# Patient Record
Sex: Male | Born: 1985 | Race: White | Hispanic: No | Marital: Single | Smoking: Former smoker
Health system: Southern US, Community
[De-identification: ages and names within clinical notes are randomized; demographics above are authoritative.]

## PROBLEM LIST (undated history)

## (undated) DIAGNOSIS — K219 Gastro-esophageal reflux disease without esophagitis: Secondary | ICD-10-CM

---

## 2008-09-19 ENCOUNTER — Emergency Department (HOSPITAL_COMMUNITY): Admission: EM | Admit: 2008-09-19 | Discharge: 2008-09-19 | Payer: Self-pay | Admitting: Emergency Medicine

## 2009-02-09 ENCOUNTER — Emergency Department (HOSPITAL_COMMUNITY): Admission: EM | Admit: 2009-02-09 | Discharge: 2009-02-09 | Payer: Self-pay | Admitting: Emergency Medicine

## 2009-07-17 ENCOUNTER — Emergency Department (HOSPITAL_COMMUNITY): Admission: EM | Admit: 2009-07-17 | Discharge: 2009-07-17 | Payer: Self-pay | Admitting: Emergency Medicine

## 2009-07-30 ENCOUNTER — Emergency Department (HOSPITAL_COMMUNITY): Admission: EM | Admit: 2009-07-30 | Discharge: 2009-07-30 | Payer: Self-pay | Admitting: Emergency Medicine

## 2009-10-01 ENCOUNTER — Emergency Department (HOSPITAL_COMMUNITY): Admission: EM | Admit: 2009-10-01 | Discharge: 2009-10-02 | Payer: Self-pay | Admitting: Emergency Medicine

## 2009-10-24 ENCOUNTER — Emergency Department (HOSPITAL_COMMUNITY): Admission: EM | Admit: 2009-10-24 | Discharge: 2009-10-24 | Payer: Self-pay | Admitting: Emergency Medicine

## 2009-11-06 ENCOUNTER — Emergency Department (HOSPITAL_COMMUNITY): Admission: EM | Admit: 2009-11-06 | Discharge: 2009-11-07 | Payer: Self-pay | Admitting: Emergency Medicine

## 2009-11-07 ENCOUNTER — Emergency Department (HOSPITAL_COMMUNITY): Admission: EM | Admit: 2009-11-07 | Discharge: 2009-11-07 | Payer: Self-pay | Admitting: Emergency Medicine

## 2009-11-09 ENCOUNTER — Emergency Department (HOSPITAL_COMMUNITY): Admission: EM | Admit: 2009-11-09 | Discharge: 2009-11-09 | Payer: Self-pay | Admitting: Emergency Medicine

## 2009-11-12 ENCOUNTER — Emergency Department (HOSPITAL_COMMUNITY): Admission: EM | Admit: 2009-11-12 | Discharge: 2009-11-12 | Payer: Self-pay | Admitting: Family Medicine

## 2009-11-13 ENCOUNTER — Emergency Department (HOSPITAL_COMMUNITY): Admission: EM | Admit: 2009-11-13 | Discharge: 2009-11-13 | Payer: Self-pay | Admitting: Emergency Medicine

## 2009-11-15 ENCOUNTER — Emergency Department (HOSPITAL_COMMUNITY): Admission: EM | Admit: 2009-11-15 | Discharge: 2009-11-15 | Payer: Self-pay | Admitting: Emergency Medicine

## 2009-11-16 ENCOUNTER — Emergency Department (HOSPITAL_COMMUNITY): Admission: EM | Admit: 2009-11-16 | Discharge: 2009-11-16 | Payer: Self-pay | Admitting: Emergency Medicine

## 2009-11-19 ENCOUNTER — Emergency Department (HOSPITAL_COMMUNITY): Admission: EM | Admit: 2009-11-19 | Discharge: 2009-11-19 | Payer: Self-pay | Admitting: Emergency Medicine

## 2009-11-26 ENCOUNTER — Ambulatory Visit: Payer: Self-pay | Admitting: Internal Medicine

## 2009-11-29 ENCOUNTER — Emergency Department (HOSPITAL_COMMUNITY): Admission: EM | Admit: 2009-11-29 | Discharge: 2009-11-29 | Payer: Self-pay | Admitting: Emergency Medicine

## 2009-11-30 ENCOUNTER — Ambulatory Visit (HOSPITAL_COMMUNITY): Admission: RE | Admit: 2009-11-30 | Discharge: 2009-11-30 | Payer: Self-pay | Admitting: Family Medicine

## 2010-12-29 ENCOUNTER — Emergency Department (HOSPITAL_COMMUNITY)
Admission: EM | Admit: 2010-12-29 | Discharge: 2010-12-29 | Payer: Self-pay | Source: Home / Self Care | Admitting: Emergency Medicine

## 2011-01-06 LAB — STREP A DNA PROBE: Group A Strep Probe: NEGATIVE

## 2011-01-06 LAB — RAPID STREP SCREEN (MED CTR MEBANE ONLY): Streptococcus, Group A Screen (Direct): NEGATIVE

## 2011-01-21 IMAGING — CT CT NECK W/ CM
3 of 4 series · 16 of 33 positions shown, 19 images · IV contrast (100 ML OMNI 300)
Comparison: Neck radiographs performed 11/13/2009

CLINICAL DATA: Sore throat for 1 week, with sensation of right neck
swelling.

CT NECK WITH CONTRAST
TECHNIQUE: Multidetector CT imaging of the neck was performed with
intravenous contrast.
Contrast: 100 mL of Omnipaque 300 IV contrast

[Series 2: 2cc/30ml and 1cc/45ml · axial · 0.47mm/px · z∈[-264,-61]mm · 8 of 105 slices shown, 10 images]
[im 12/105  soft-tissue]
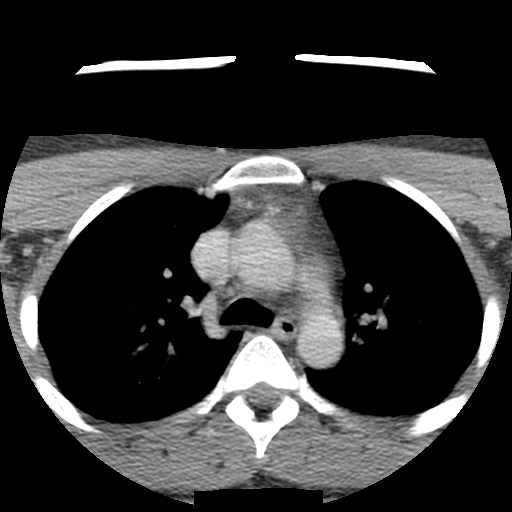
[im 12/105  bone]
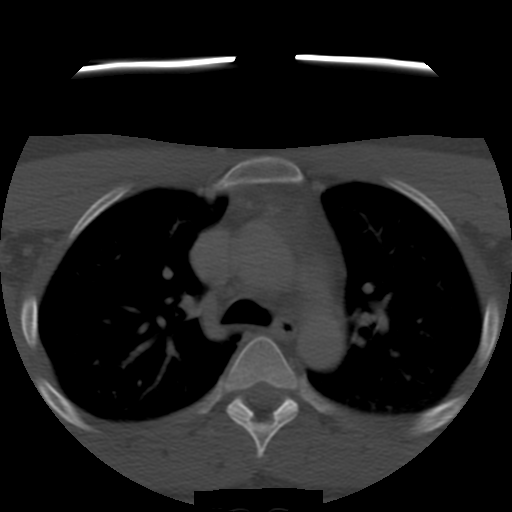
[im 24/105  bone]
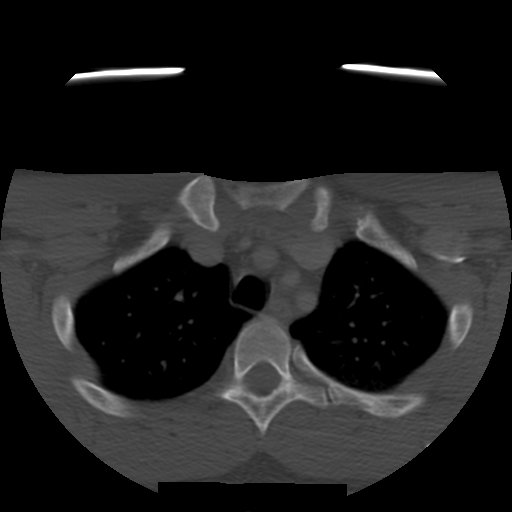
[im 35/105  bone]
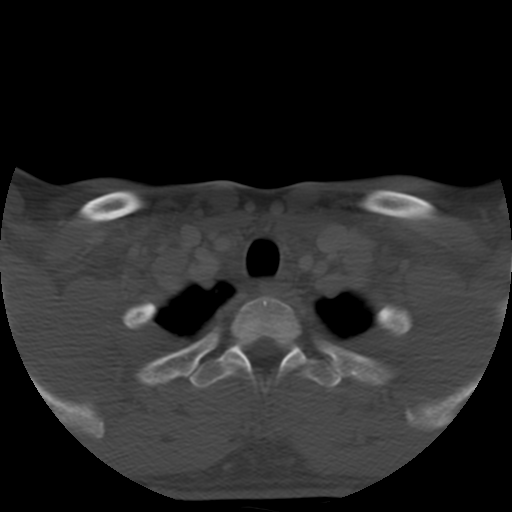
[im 47/105  bone]
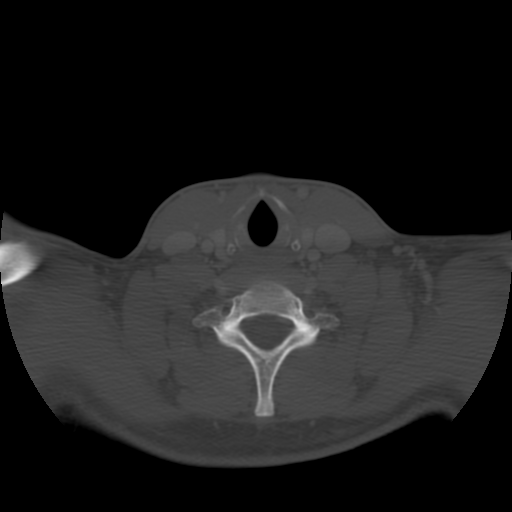
[im 58/105  soft-tissue]
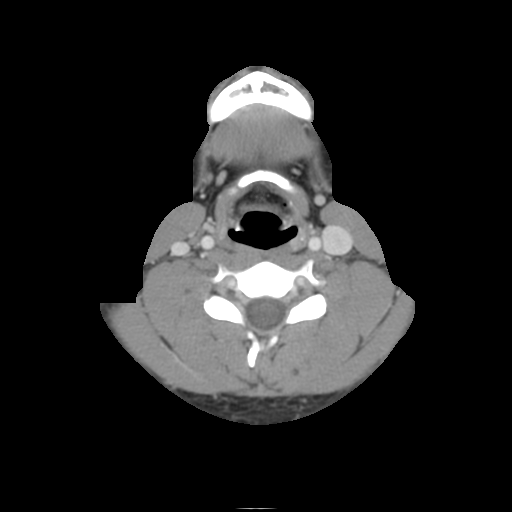
[im 58/105  bone]
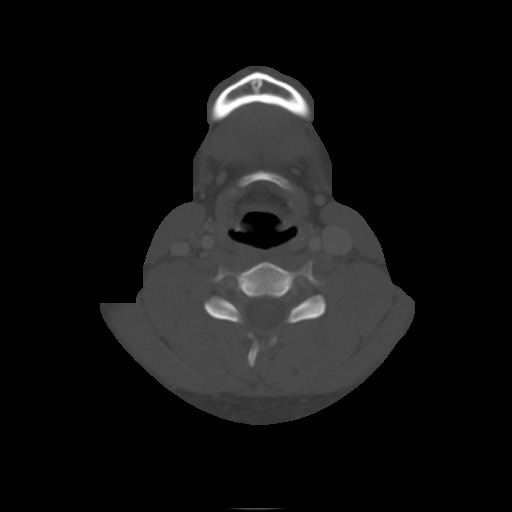
[im 70/105  bone]
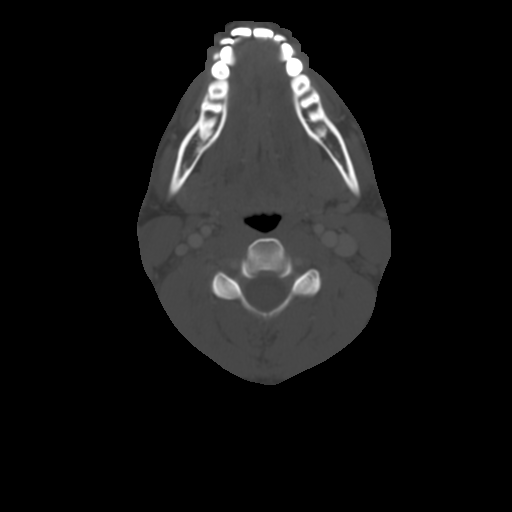
[im 81/105  bone]
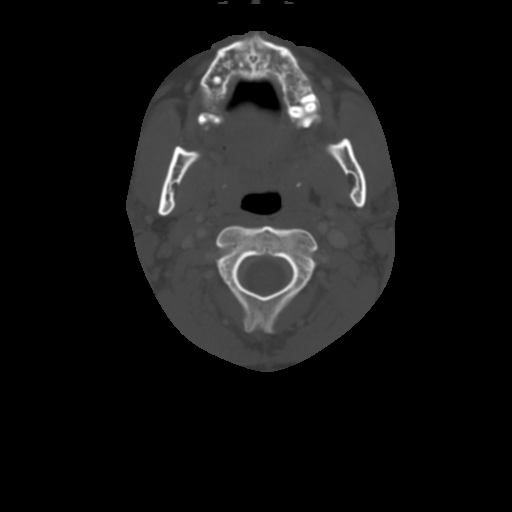
[im 93/105  bone]
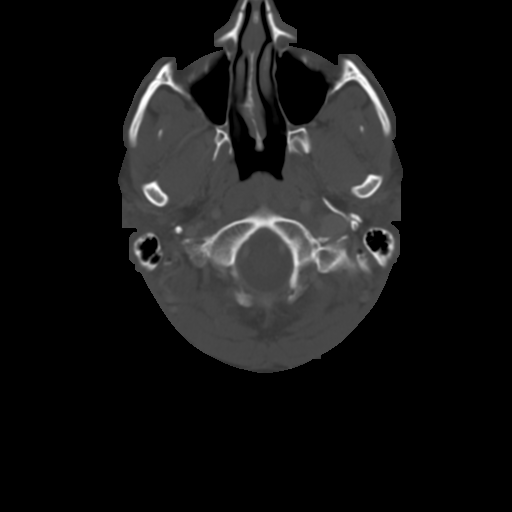

[Series 300: sagittal · sagittal · 0.52mm/px · 5 of 51 slices shown, 6 images]
[im 17/51  bone]
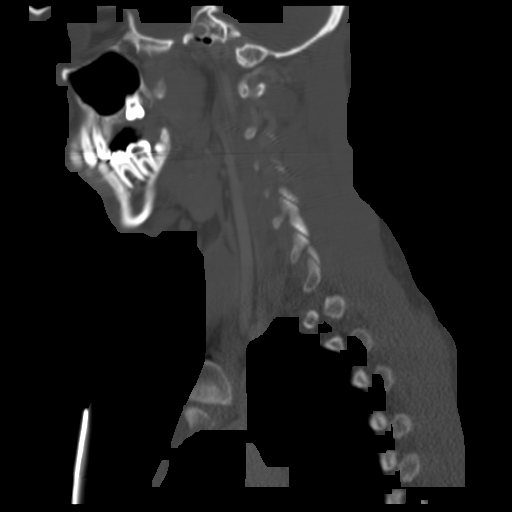
[im 21/51  bone]
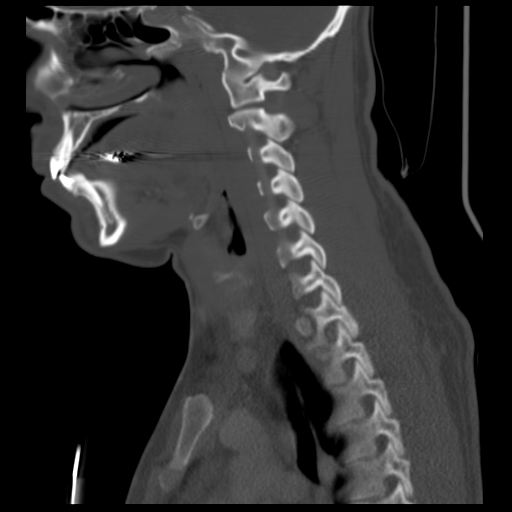
[im 26/51  soft-tissue]
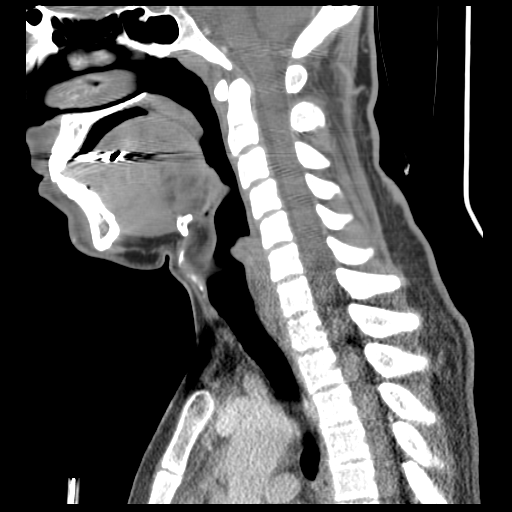
[im 26/51  bone]
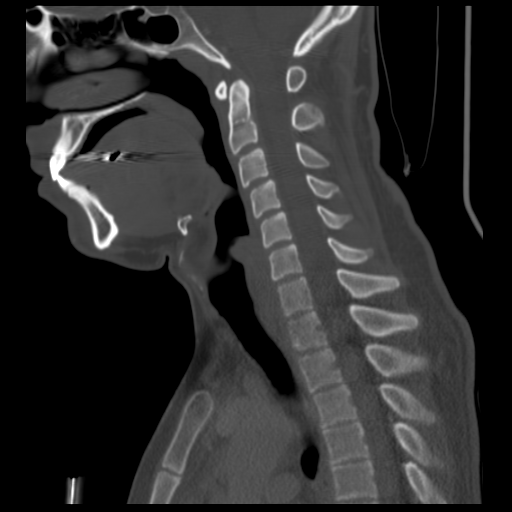
[im 30/51  bone]
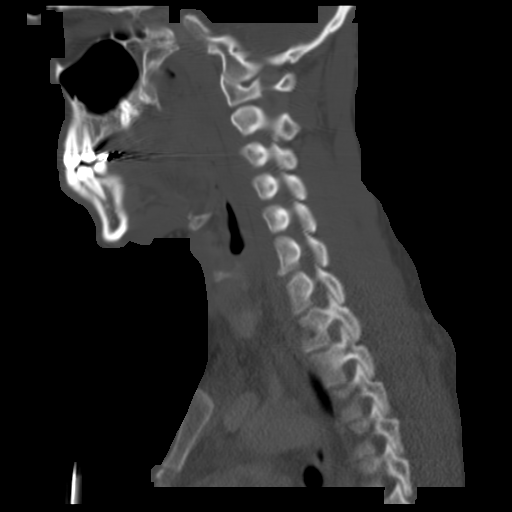
[im 34/51  bone]
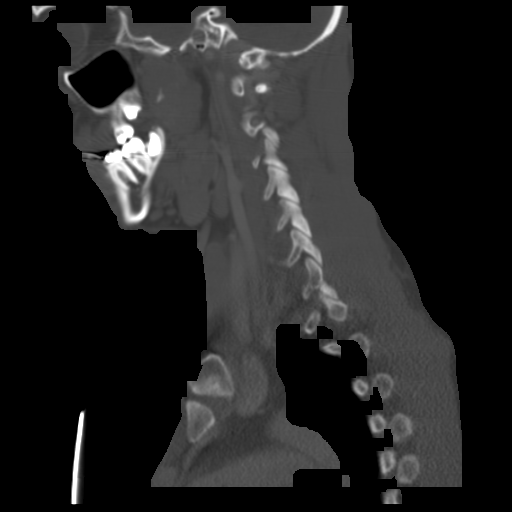

[Series 301: coronal · coronal · 0.52mm/px · 3 of 51 slices shown]
[im 11/51  bone]
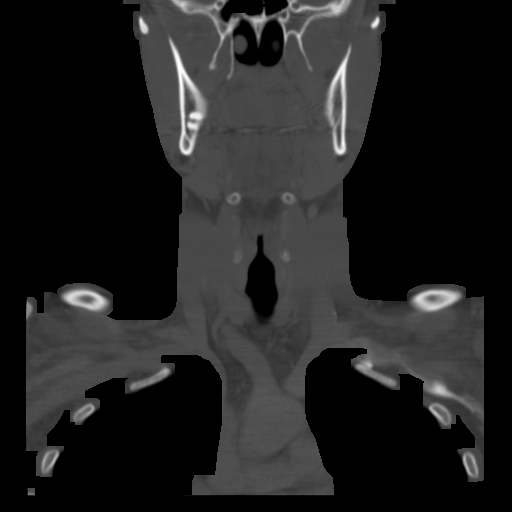
[im 21/51  bone]
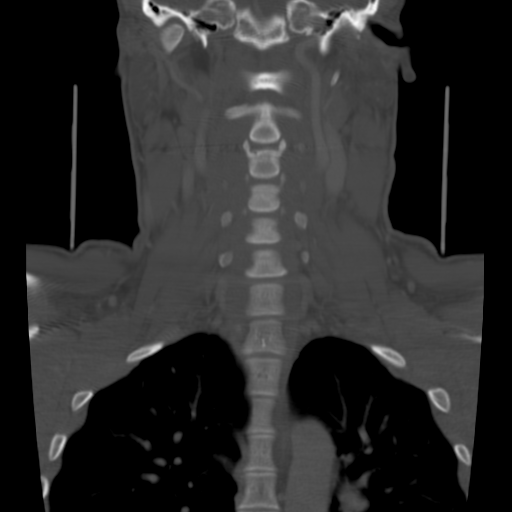
[im 31/51  bone]
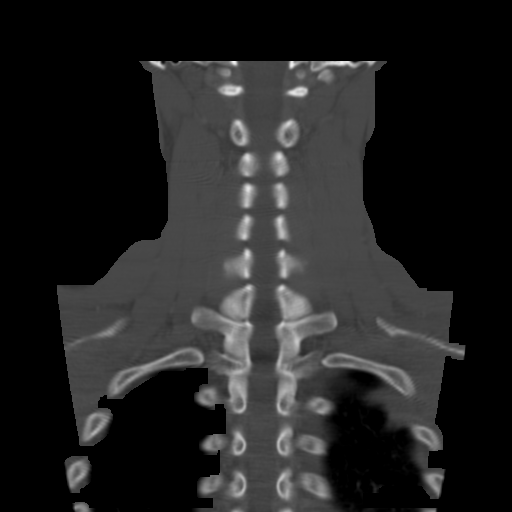

[16 of 33 positions shown; findings below may reference images not displayed]

FINDINGS: No significant soft tissue swelling is seen.  No
tonsillar edema is evident.  The parapharyngeal fat planes are
preserved.  No fluid collections are seen to suggest abscess
formation.

Scattered small peritonsillar calcifications are noted bilaterally,
reflecting chronic inflammation.  Scattered small cervical nodes
are noted bilaterally, without evidence of cervical
lymphadenopathy.

The visualized vasculature is unremarkable in appearance.  There is
no evidence of vascular compromise.  The thyroid gland is within
normal limits.  The great vessels are intact.  The visualized
portions of the mediastinum are unremarkable.  The visualized
portions of both lungs are clear.

No acute osseous abnormalities are identified.  The orbits are
within normal limits.  The paranasal sinuses and mastoid air cells
are well-aerated.  The visualized portions of the brain are
unremarkable.
IMPRESSION: 1.  Small small peritonsillar calcifications likely reflect chronic
inflammation.
2.  Otherwise unremarkable CT of the neck; no evidence of
significant soft tissue swelling or abscess formation.

## 2011-03-26 LAB — POCT I-STAT, CHEM 8
BUN: 14 mg/dL (ref 6–23)
Calcium, Ion: 1.18 mmol/L (ref 1.12–1.32)
Chloride: 101 meq/L (ref 96–112)
Creatinine, Ser: 1 mg/dL (ref 0.4–1.5)
Glucose, Bld: 91 mg/dL (ref 70–99)
HCT: 49 % (ref 39.0–52.0)
Hemoglobin: 16.7 g/dL (ref 13.0–17.0)
Potassium: 4 meq/L (ref 3.5–5.1)
Sodium: 139 meq/L (ref 135–145)
TCO2: 28 mmol/L (ref 0–100)

## 2011-03-26 LAB — URINALYSIS, ROUTINE W REFLEX MICROSCOPIC
Bilirubin Urine: NEGATIVE
Glucose, UA: NEGATIVE mg/dL
Hgb urine dipstick: NEGATIVE
Ketones, ur: NEGATIVE mg/dL
Nitrite: NEGATIVE
Protein, ur: NEGATIVE mg/dL
Specific Gravity, Urine: 1.021 (ref 1.005–1.030)
Urobilinogen, UA: 0.2 mg/dL (ref 0.0–1.0)
pH: 5.5 (ref 5.0–8.0)

## 2011-03-26 LAB — CBC
HCT: 48.4 % (ref 39.0–52.0)
Hemoglobin: 17.3 g/dL — ABNORMAL HIGH (ref 13.0–17.0)
MCHC: 35.8 g/dL (ref 30.0–36.0)
MCV: 93.1 fL (ref 78.0–100.0)
Platelets: 157 K/uL (ref 150–400)
RBC: 5.2 MIL/uL (ref 4.22–5.81)
RDW: 12.6 % (ref 11.5–15.5)
WBC: 6.4 K/uL (ref 4.0–10.5)

## 2011-03-26 LAB — DIFFERENTIAL
Basophils Absolute: 0 K/uL (ref 0.0–0.1)
Basophils Relative: 0 % (ref 0–1)
Eosinophils Absolute: 0.1 K/uL (ref 0.0–0.7)
Eosinophils Relative: 2 % (ref 0–5)
Lymphocytes Relative: 25 % (ref 12–46)
Lymphs Abs: 1.6 K/uL (ref 0.7–4.0)
Monocytes Absolute: 0.5 K/uL (ref 0.1–1.0)
Monocytes Relative: 8 % (ref 3–12)
Neutro Abs: 4.2 K/uL (ref 1.7–7.7)
Neutrophils Relative %: 66 % (ref 43–77)

## 2011-03-26 LAB — BASIC METABOLIC PANEL WITH GFR
BUN: 12 mg/dL (ref 6–23)
CO2: 27 meq/L (ref 19–32)
Calcium: 9.7 mg/dL (ref 8.4–10.5)
Chloride: 101 meq/L (ref 96–112)
Creatinine, Ser: 0.86 mg/dL (ref 0.4–1.5)
GFR calc non Af Amer: >60 mL/min
Glucose, Bld: 84 mg/dL (ref 70–99)
Potassium: 3.9 meq/L (ref 3.5–5.1)
Sodium: 137 meq/L (ref 135–145)

## 2011-03-26 LAB — RAPID STREP SCREEN (MED CTR MEBANE ONLY): Streptococcus, Group A Screen (Direct): NEGATIVE

## 2011-03-26 LAB — GLUCOSE, CAPILLARY: Glucose-Capillary: 85 mg/dL (ref 70–99)

## 2011-03-29 LAB — RAPID STREP SCREEN (MED CTR MEBANE ONLY): Streptococcus, Group A Screen (Direct): NEGATIVE

## 2011-03-30 LAB — CBC
HCT: 45.2 % (ref 39.0–52.0)
Hemoglobin: 15.8 g/dL (ref 13.0–17.0)
MCHC: 34.9 g/dL (ref 30.0–36.0)
MCV: 92.8 fL (ref 78.0–100.0)
Platelets: 155 10*3/uL (ref 150–400)
RBC: 4.87 MIL/uL (ref 4.22–5.81)
RDW: 13 % (ref 11.5–15.5)
WBC: 6.3 10*3/uL (ref 4.0–10.5)

## 2011-03-30 LAB — D-DIMER, QUANTITATIVE: D-Dimer, Quant: 0.26 ug/mL-FEU (ref 0.00–0.48)

## 2011-03-30 LAB — POCT CARDIAC MARKERS
CKMB, poc: 1.1 ng/mL (ref 1.0–8.0)
CKMB, poc: <1 ng/mL — ABNORMAL LOW (ref 1.0–8.0)
Myoglobin, poc: 64.3 ng/mL (ref 12–200)
Myoglobin, poc: 74.6 ng/mL (ref 12–200)
Troponin i, poc: <0.05 ng/mL (ref 0.00–0.09)
Troponin i, poc: <0.05 ng/mL (ref 0.00–0.09)

## 2011-03-30 LAB — DIFFERENTIAL
Basophils Absolute: 0 10*3/uL (ref 0.0–0.1)
Basophils Relative: 0 % (ref 0–1)
Eosinophils Absolute: 0.3 10*3/uL (ref 0.0–0.7)
Eosinophils Relative: 5 % (ref 0–5)
Lymphocytes Relative: 28 % (ref 12–46)
Lymphs Abs: 1.8 10*3/uL (ref 0.7–4.0)
Monocytes Absolute: 0.7 10*3/uL (ref 0.1–1.0)
Monocytes Relative: 11 % (ref 3–12)
Neutro Abs: 3.5 10*3/uL (ref 1.7–7.7)
Neutrophils Relative %: 56 % (ref 43–77)

## 2011-03-30 LAB — POCT I-STAT, CHEM 8
BUN: 11 mg/dL (ref 6–23)
Calcium, Ion: 1.18 mmol/L (ref 1.12–1.32)
Chloride: 102 mEq/L (ref 96–112)
Creatinine, Ser: 1 mg/dL (ref 0.4–1.5)
Glucose, Bld: 97 mg/dL (ref 70–99)
HCT: 48 % (ref 39.0–52.0)
Hemoglobin: 16.3 g/dL (ref 13.0–17.0)
Potassium: 3.6 mEq/L (ref 3.5–5.1)
Sodium: 140 mEq/L (ref 135–145)
TCO2: 27 mmol/L (ref 0–100)

## 2012-02-25 ENCOUNTER — Encounter (HOSPITAL_COMMUNITY): Payer: Self-pay | Admitting: *Deleted

## 2012-02-25 ENCOUNTER — Emergency Department (HOSPITAL_COMMUNITY): Payer: Self-pay

## 2012-02-25 ENCOUNTER — Emergency Department (HOSPITAL_COMMUNITY)
Admission: EM | Admit: 2012-02-25 | Discharge: 2012-02-25 | Disposition: A | Discharge status: Home / Self Care | Payer: Self-pay | Attending: Emergency Medicine | Admitting: Emergency Medicine

## 2012-02-25 DIAGNOSIS — J40 Bronchitis, not specified as acute or chronic: Secondary | ICD-10-CM | POA: Insufficient documentation

## 2012-02-25 DIAGNOSIS — R0602 Shortness of breath: Secondary | ICD-10-CM | POA: Insufficient documentation

## 2012-02-25 DIAGNOSIS — R42 Dizziness and giddiness: Secondary | ICD-10-CM | POA: Insufficient documentation

## 2012-02-25 DIAGNOSIS — R079 Chest pain, unspecified: Secondary | ICD-10-CM | POA: Insufficient documentation

## 2012-02-25 HISTORY — DX: Gastro-esophageal reflux disease without esophagitis: K21.9

## 2012-02-25 LAB — CBC
MCH: 31.7 pg (ref 26.0–34.0)
MCHC: 35.3 g/dL (ref 30.0–36.0)
MCV: 89.8 fL (ref 78.0–100.0)
Platelets: 192 10*3/uL (ref 150–400)
RBC: 4.82 MIL/uL (ref 4.22–5.81)

## 2012-02-25 LAB — BASIC METABOLIC PANEL
CO2: 29 mEq/L (ref 19–32)
Calcium: 9.8 mg/dL (ref 8.4–10.5)
Creatinine, Ser: 0.86 mg/dL (ref 0.50–1.35)
GFR calc non Af Amer: >90 mL/min (ref >90)
Sodium: 139 mEq/L (ref 135–145)

## 2012-02-25 LAB — TROPONIN I: Troponin I: <0.3 ng/mL (ref <0.30)

## 2012-02-25 LAB — D-DIMER, QUANTITATIVE: D-Dimer, Quant: <0.22 ug/mL-FEU (ref 0.00–0.48)

## 2012-02-25 NOTE — Discharge Instructions (Signed)
As we discussed, your tests today did not show that your pain was caused by a heart attack, pneumonia, or a blood clot in your lungs. Your EKG did not show any concerning findings. Your labs were otherwise normal. You should follow-up with a regular doctor for further discussion of your symptoms to see if any additional testing is needed.     Chest Pain (Nonspecific) It is often hard to give a specific diagnosis for the cause of chest pain. There is always a chance that your pain could be related to something serious, such as a heart attack or a blood clot in the lungs. You need to follow up with your caregiver for further evaluation. CAUSES   Heartburn.   Pneumonia or bronchitis.   Anxiety or stress.   Inflammation around your heart (pericarditis) or lung (pleuritis or pleurisy).   A blood clot in the lung.   A collapsed lung (pneumothorax). It can develop suddenly on its own (spontaneous pneumothorax) or from injury (trauma) to the chest.   Shingles infection (herpes zoster virus).  The chest wall is composed of bones, muscles, and cartilage. Any of these can be the source of the pain.  The bones can be bruised by injury.   The muscles or cartilage can be strained by coughing or overwork.   The cartilage can be affected by inflammation and become sore (costochondritis).  DIAGNOSIS  Lab tests or other studies, such as X-rays, electrocardiography, stress testing, or cardiac imaging, may be needed to find the cause of your pain.  TREATMENT   Treatment depends on what may be causing your chest pain. Treatment may include:   Acid blockers for heartburn.   Anti-inflammatory medicine.   Pain medicine for inflammatory conditions.   Antibiotics if an infection is present.   You may be advised to change lifestyle habits. This includes stopping smoking and avoiding alcohol, caffeine, and chocolate.   You may be advised to keep your head raised (elevated) when sleeping. This reduces  the chance of acid going backward from your stomach into your esophagus.   Most of the time, nonspecific chest pain will improve within 2 to 3 days with rest and mild pain medicine.  HOME CARE INSTRUCTIONS   If antibiotics were prescribed, take your antibiotics as directed. Finish them even if you start to feel better.   For the next few days, avoid physical activities that bring on chest pain. Continue physical activities as directed.   Do not smoke.   Avoid drinking alcohol.   Only take over-the-counter or prescription medicine for pain, discomfort, or fever as directed by your caregiver.   Follow your caregiver's suggestions for further testing if your chest pain does not go away.   Keep any follow-up appointments you made. If you do not go to an appointment, you could develop lasting (chronic) problems with pain. If there is any problem keeping an appointment, you must call to reschedule.  SEEK MEDICAL CARE IF:   You think you are having problems from the medicine you are taking. Read your medicine instructions carefully.   Your chest pain does not go away, even after treatment.   You develop a rash with blisters on your chest.  SEEK IMMEDIATE MEDICAL CARE IF:   You have increased chest pain or pain that spreads to your arm, neck, jaw, back, or abdomen.   You develop shortness of breath, an increasing cough, or you are coughing up blood.   You have severe back or abdominal pain,  feel nauseous, or vomit.   You develop severe weakness, fainting, or chills.   You have a fever.  THIS IS AN EMERGENCY. Do not wait to see if the pain will go away. Get medical help at once. Call your local emergency services (911 in [place] Do not drive yourself to the hospital. MAKE SURE YOU:   Understand these instructions.   Will watch your condition.   Will get help right away if you are not doing well or get worse.  Document Released: 09/17/2005 Document Revised: 11/27/2011 Document  Reviewed: 07/13/2008 [facility] Patient Information 2012 [place], [place].          Bronchitis Bronchitis is the body's way of reacting to injury and/or infection (inflammation) of the bronchi. Bronchi are the air tubes that extend from the windpipe into the lungs. If the inflammation becomes severe, it may cause shortness of breath. CAUSES  Inflammation may be caused by:  A virus.   Germs (bacteria).   Dust.   Allergens.   Pollutants and many other irritants.  The cells lining the bronchial tree are covered with tiny hairs (cilia). These constantly beat upward, away from the lungs, toward the mouth. This keeps the lungs free of pollutants. When these cells become too irritated and are unable to do their job, mucus begins to develop. This causes the characteristic cough of bronchitis. The cough clears the lungs when the cilia are unable to do their job. Without either of these protective mechanisms, the mucus would settle in the lungs. Then you would develop pneumonia. Smoking is a common cause of bronchitis and can contribute to pneumonia. Stopping this habit is the single most important thing you can do to help yourself. TREATMENT   Your caregiver may prescribe an antibiotic if the cough is caused by bacteria. Also, medicines that open up your airways make it easier to breathe. Your caregiver may also recommend or prescribe an expectorant. It will loosen the mucus to be coughed up. Only take over-the-counter or prescription medicines for pain, discomfort, or fever as directed by your caregiver.   Removing whatever causes the problem (smoking, for example) is critical to preventing the problem from getting worse.   Cough suppressants may be prescribed for relief of cough symptoms.   Inhaled medicines may be prescribed to help with symptoms now and to help prevent problems from returning.   For those with recurrent (chronic) bronchitis, there may be a need for steroid medicines.    SEEK IMMEDIATE MEDICAL CARE IF:   During treatment, you develop more pus-like mucus (purulent sputum).   You have a fever.   Your baby is older than 3 months with a rectal temperature of 102 F (38.9 C) or higher.   Your baby is 58 months old or younger with a rectal temperature of 100.4 F (38 C) or higher.   You become progressively more ill.   You have increased difficulty breathing, wheezing, or shortness of breath.  It is necessary to seek immediate medical care if you are elderly or sick from any other disease. MAKE SURE YOU:   Understand these instructions.   Will watch your condition.   Will get help right away if you are not doing well or get worse.  Document Released: 12/08/2005 Document Revised: 11/27/2011 Document Reviewed: 10/17/2008 [facility] Patient Information 2012 [place], [place].       If you have no primary doctor, here are some resources that may be helpful:  Medicaid-accepting [facility] Providers:   - [facility]- [address]      [phone]      Mon-Fri 9am-7pm, Sat 9am-1pm   - [facility]- [address] [place]      [phone]   - [facility]- [address], [address] [place]      [phone]   [facility]- [address]      [phone]   - Renaye Rakers- [address] [place], [address]      [phone]      Only accepts [place] patients       after they have her name applied to their card   Self Pay (no insurance) in [place]:   - Sickle Cell Patients: Dr Willey Blade, [facility]      [address] [place]      [phone]   - [phone]   - Physician Referral Service- [phone]   - [facility]- [address] [place]      [phone]   [facility] [place]- [address]   - [facility]- see information above      (Speak to [employer] if you do not  have insurance)   - [facility]- [address] [place]      [phone]   - [facility] [place]- [address]      [phone]   - [facility]- [address]      [phone]   - Dr Julio Sicks-  [address], [place]      [phone]   - [facility]- [address]      [phone]   - [facility]- [address]      [phone]      Also [address]      [phone]   - [facility]- [address]      [phone]      1st and 3rd Saturday every month, 10am-1pm Other agencies that provide inexpensive medical care:    [facility]  [phone]    [facility]  [phone]    [facility]  [phone]    [facility]  [phone]    [facility]  [phone]  General Information: Finding a doctor when you do not have health insurance can be tricky. Although you are not limited by an insurance plan, you are of course limited by her finances and how much but he can pay out of pocket.  What are your options if you don't have health insurance?   1) Find a Librarian, academic and Pay Out of Pocket Although you won't have to find out who is covered by your insurance plan, it is a good idea to ask around and get recommendations. You will then need to call the office and see if the doctor you have chosen will accept you as a new patient and what types of options they offer for patients who are self-pay. Some doctors offer discounts or will set up payment plans for their patients who do not have insurance, but you will need to ask so you aren't surprised when you get to your appointment.  2) Contact Your Local Health Department Not all health departments have doctors that can see patients for sick visits, but many do, so  it is worth a call to see if yours does. If you don't know where your local health department is, you can check in your phone book. The CDC also has a tool to help you locate your  state's health department, and many state websites also have listings of all of their local health departments.  3) Find a Walk-in Clinic If your illness is not likely to be very severe or complicated, you may want to try a walk in clinic. These are popping up all over the country in pharmacies, drugstores, and shopping centers. They're usually staffed by nurse practitioners or physician assistants that have been trained to treat common illnesses and complaints. They're usually fairly quick and inexpensive. However, if you have serious medical issues or chronic medical problems, these are probably not your best option

## 2012-02-25 NOTE — ED Notes (Signed)
MD at bedside. Dr. Manus Gunning at bedside. Pt ready for d/c.

## 2012-02-25 NOTE — Progress Notes (Signed)
Pt listed as self pay with no insurance coverage Pt confirms he is self pay [place] resident.  CM and [facility] coordinator spoke with him Pt refused offered [facility] services to assist with finding a [place] self pay provider 

## 2012-02-25 NOTE — Progress Notes (Signed)
Pt refused [facility] service because he is awaiting for probational period to end at new job and employee insurance to "kick in"
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
## 2012-02-25 NOTE — ED Notes (Signed)
MD at bedside. 

## 2012-02-25 NOTE — ED Notes (Signed)
Pt states "for about a wk and a half, I've had left sided chest pain that makes my left arm hurt, today I felt dizzy and it scared me, I run 5k on the treadmill every day"

## 2012-02-26 NOTE — ED Provider Notes (Signed)
Medical screening examination/treatment/procedure(s) were conducted as a shared visit with non-physician practitioner(s) and myself.  I personally evaluated the patient during the encounter  2 weeks of intermittent L sided chest soreness.  Today with different R sided "kicked in the chest" pain that lasted 1 minute.  Now resolved.  Some SOB.  Runs daily on treadmill without problems.  No family history of sudden cardiac death. EKG without ischemia.  No Brugada, no LVH Atypical chest pain.   Glynn Octave, MD 02/26/12 [phone]

## 2012-09-03 ENCOUNTER — Encounter (HOSPITAL_COMMUNITY): Payer: Self-pay

## 2012-09-03 ENCOUNTER — Emergency Department (HOSPITAL_COMMUNITY)
Admission: EM | Admit: 2012-09-03 | Discharge: 2012-09-04 | Disposition: A | Discharge status: Home / Self Care | Payer: No Typology Code available for payment source | Attending: Emergency Medicine | Admitting: Emergency Medicine

## 2012-09-03 DIAGNOSIS — Z87891 Personal history of nicotine dependence: Secondary | ICD-10-CM | POA: Insufficient documentation

## 2012-09-03 DIAGNOSIS — Y92410 Unspecified street and highway as the place of occurrence of the external cause: Secondary | ICD-10-CM | POA: Insufficient documentation

## 2012-09-03 DIAGNOSIS — K219 Gastro-esophageal reflux disease without esophagitis: Secondary | ICD-10-CM | POA: Insufficient documentation

## 2012-09-03 DIAGNOSIS — M436 Torticollis: Secondary | ICD-10-CM | POA: Insufficient documentation

## 2012-09-03 MED ORDER — ACETAMINOPHEN 325 MG PO TABS
650.0000 mg | ORAL_TABLET | Freq: Once | ORAL | Status: AC
  Administered 2012-09-03: 650 mg via ORAL
  Filled 2012-09-03: qty 2
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
## 2012-09-04 ENCOUNTER — Emergency Department (HOSPITAL_COMMUNITY): Payer: No Typology Code available for payment source

## 2012-09-04 MED ORDER — CYCLOBENZAPRINE HCL 10 MG PO TABS: 10.0000 mg | ORAL_TABLET | Freq: Three times a day (TID) | ORAL | Status: AC | PRN

## 2012-09-04 MED ORDER — IBUPROFEN 400 MG PO TABS: 600.0000 mg | ORAL_TABLET | Freq: Four times a day (QID) | ORAL | Status: AC | PRN

## 2012-09-04 NOTE — ED Provider Notes (Signed)
Medical screening examination/treatment/procedure(s) were performed by non-physician practitioner and as supervising physician I was immediately available for consultation/collaboration. Devoria Albe, MD, FACEP   Ward Givens, MD 09/04/12 1500

## 2012-09-04 NOTE — ED Notes (Signed)
Patient transported to X-ray 

## 2012-09-06 ENCOUNTER — Emergency Department (INDEPENDENT_AMBULATORY_CARE_PROVIDER_SITE_OTHER): Payer: Self-pay

## 2012-09-06 ENCOUNTER — Encounter (HOSPITAL_COMMUNITY): Payer: Self-pay | Admitting: Emergency Medicine

## 2012-09-06 ENCOUNTER — Emergency Department (INDEPENDENT_AMBULATORY_CARE_PROVIDER_SITE_OTHER)
Admission: EM | Admit: 2012-09-06 | Discharge: 2012-09-06 | Disposition: A | Discharge status: Home / Self Care | Payer: Self-pay | Source: Home / Self Care

## 2012-09-06 DIAGNOSIS — S139XXA Sprain of joints and ligaments of unspecified parts of neck, initial encounter: Secondary | ICD-10-CM

## 2012-09-06 DIAGNOSIS — S161XXA Strain of muscle, fascia and tendon at neck level, initial encounter: Secondary | ICD-10-CM
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
## 2012-09-07 NOTE — ED Provider Notes (Signed)
Medical screening examination/treatment/procedure(s) were performed by resident physician or non-physician practitioner and as supervising physician I was immediately available for consultation/collaboration.   Barkley Bruns MD.    Linna Hoff, MD 09/07/12 [phone]

## 2015-09-24 ENCOUNTER — Encounter (HOSPITAL_COMMUNITY): Payer: Self-pay | Admitting: *Deleted

## 2015-09-24 ENCOUNTER — Emergency Department (HOSPITAL_COMMUNITY)
Admission: EM | Admit: 2015-09-24 | Discharge: 2015-09-24 | Disposition: A | Discharge status: Home / Self Care | Payer: Managed Care, Other (non HMO) | Attending: Emergency Medicine | Admitting: Emergency Medicine

## 2015-09-24 DIAGNOSIS — Z79899 Other long term (current) drug therapy: Secondary | ICD-10-CM | POA: Insufficient documentation

## 2015-09-24 DIAGNOSIS — R200 Anesthesia of skin: Secondary | ICD-10-CM | POA: Diagnosis not present

## 2015-09-24 DIAGNOSIS — K219 Gastro-esophageal reflux disease without esophagitis: Secondary | ICD-10-CM | POA: Diagnosis not present

## 2015-09-24 DIAGNOSIS — Z87891 Personal history of nicotine dependence: Secondary | ICD-10-CM | POA: Insufficient documentation

## 2015-09-24 DIAGNOSIS — R42 Dizziness and giddiness: Secondary | ICD-10-CM | POA: Diagnosis present

## 2015-09-24 DIAGNOSIS — R110 Nausea: Secondary | ICD-10-CM | POA: Diagnosis not present

## 2015-09-24 DIAGNOSIS — Z791 Long term (current) use of non-steroidal anti-inflammatories (NSAID): Secondary | ICD-10-CM | POA: Diagnosis not present

## 2015-09-24 LAB — BASIC METABOLIC PANEL
Anion gap: 7 (ref 5–15)
BUN: 13 mg/dL (ref 6–20)
CO2: 27 mmol/L (ref 22–32)
Calcium: 9.6 mg/dL (ref 8.9–10.3)
Chloride: 103 mmol/L (ref 101–111)
Creatinine, Ser: 0.99 mg/dL (ref 0.61–1.24)
GFR calc Af Amer: >60 mL/min (ref >60)
GLUCOSE: 120 mg/dL — AB (ref 65–99)
POTASSIUM: 3.7 mmol/L (ref 3.5–5.1)
Sodium: 137 mmol/L (ref 135–145)

## 2015-09-24 LAB — CBC
HEMATOCRIT: 44.6 % (ref 39.0–52.0)
Hemoglobin: 15.9 g/dL (ref 13.0–17.0)
MCH: 32.6 pg (ref 26.0–34.0)
MCHC: 35.7 g/dL (ref 30.0–36.0)
MCV: 91.4 fL (ref 78.0–100.0)
Platelets: 157 10*3/uL (ref 150–400)
RBC: 4.88 MIL/uL (ref 4.22–5.81)
RDW: 12.4 % (ref 11.5–15.5)
WBC: 6.3 10*3/uL (ref 4.0–10.5)

## 2015-09-24 LAB — CBG MONITORING, ED: Glucose-Capillary: 110 mg/dL — ABNORMAL HIGH (ref 65–99)

## 2015-09-24 MED ORDER — MECLIZINE HCL 50 MG PO TABS: 25.0000 mg | ORAL_TABLET | Freq: Three times a day (TID) | ORAL | Status: AC | PRN
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
## 2015-09-24 NOTE — Discharge Instructions (Signed)
Please take your prescription of meclizine as prescribed when needed for dizziness/lightheadedness.  Please follow up with a primary care provider from the Resource Guide provided below in 1 week. Please return to the Emergency Department if symptoms worsen or new onset of fever, neck stiffness, visual changes, photophobia, urinary symptoms, weakness, seizures, syncope.    Emergency Department Resource Guide 1) Find a Doctor and Pay Out of Pocket Although you won't have to find out who is covered by your insurance plan, it is a good idea to ask around and get recommendations. You will then need to call the office and see if the doctor you have chosen will accept you as a new patient and what types of options they offer for patients who are self-pay. Some doctors offer discounts or will set up payment plans for their patients who do not have insurance, but you will need to ask so you aren't surprised when you get to your appointment.  2) Contact Your Local Health Department Not all health departments have doctors that can see patients for sick visits, but many do, so it is worth a call to see if yours does. If you don't know where your local health department is, you can check in your phone book. The CDC also has a tool to help you locate your state's health department, and many state websites also have listings of all of their local health departments.  3) Find a Walk-in Clinic If your illness is not likely to be very severe or complicated, you may want to try a walk in clinic. These are popping up all over the country in pharmacies, drugstores, and shopping centers. They're usually staffed by nurse practitioners or physician assistants that have been trained to treat common illnesses and complaints. They're usually fairly quick and inexpensive. However, if you have serious medical issues or chronic medical problems, these are probably not your best option.  No Primary Care Doctor: - Call Health  Connect at  [phone] - they can help you locate a primary care doctor that  accepts your insurance, provides certain services, etc. - Physician Referral Service- [phone]  Chronic Pain Problems: Organization         Address  Phone   Notes  [facility]  [phone] Patients need to be referred by their primary care doctor.   Medication Assistance: Organization         Address  Phone   Notes  [facility] [address] [place] [place], [place] [address] [phone] --Must be a resident of [facility] -- Must have NO insurance coverage whatsoever (no Medicaid/ Medicare, etc.) -- The pt. MUST have a primary care doctor that directs their care regularly and follows them in the community   Med[facility]ist  [phone]   [employer]  [phone]    Agencies that provide inexpensive medical care: Organization         Address  Phone   Notes  [facility]  [phone]   [facility]    [phone]   [facility] [address] [place], [place] [address] [phone]   [facility] [address] [place] [place] [phone]   Planned Parenthood    [phone]   [facility]    [phone]   Community Health and [facility]  [address], [place] Phone:  [phone], Fax:  [phone]  Hours of Operation:  9 am - 6 pm, M-F.  Also accepts Medicaid/Medicare and self-pay.  [facility]  [address], [place] Phone: [phone]) [phone], Fax: [phone]. Hours of Operation:  8:30 am - 5:30 pm, M-F.  Also accepts Medicaid and self-pay.  [facility] [place] [address], [place] Phone: [phone]) [phone]   [facility] [address], [place], [place] [phone], Ext. [phone] Mondays & Thursdays: 7-9 AM.  First 15 patients are seen on a first come, first serve basis.     Medicaid-accepting [facility] Providers:  Organization         Address  Phone   Notes  [facility] [address], [place] [phone]) [phone] Also accepts self-pay patients.  [facility] [address], [place]  [phone]) [phone]   [facility] [address], [place] [phone]   [facility] [address] [place] [address], [place] [phone]) [phone]   Renaye RakersVeita Bland [address], [place]   [phone] Only accepts [place] Access [place] patients after they have their name applied to their card.   Self-Pay (no insurance) in [facility]:  Organization         Address  Phone   Notes  Sickle Cell Patients, [facility] [address], [place] [phone]) [phone]   [facility] [address], [place] [phone]   [facility]  [address], [place] [phone]   [facility]/Dr. Osei-Bonsu  [address] [place] [address], [place] or [phone], [place] [phone] Phone number for both [place] and [place] locations is the same.  [facility] and [facility] [address], [place] [phone]   [facility] [place] [address] [place] [address], [place] or [address] [phone]) [phone]   [facility] [address], [place] [phone], phone; [phone]) [phone], fax Sees patients 1st and 3rd Saturday of every month.  Must not qualify for public or private insurance (i.e. Medicaid, Medicare, [place] Health Choice, Veterans' Benefits)  Household income should be no more than 200% of the poverty level The clinic cannot treat you if you are pregnant or think you are pregnant  Sexually transmitted diseases are not treated at the clinic.    Dental Care: Organization         Address  Phone  Notes  [facility]  Department of [facility] [address], [place] [phone] Accepts children up to age 30 who are enrolled in [place] or [place] Health Choice; pregnant women with a Medicaid card; and children who have applied for Medicaid or [place] Health Choice, but were declined, whose parents can pay a reduced fee at time of service.  [facility] Department of [facility] [place]  [address], [place] [phone] Accepts children up to age 30 who are enrolled in [place] or [place] Health Choice; pregnant women with a Medicaid card; and children who have applied for Medicaid or [place] Health Choice, but were declined, whose parents can pay a reduced fee at time of service.  [facility]  [address], [place] [phone] Patients are seen by appointment only. Walk-ins are not accepted. [facility] will  see patients 85 years of age and older. Monday - Tuesday (8am-5pm) Most Wednesdays (8:30-5pm) $30 per visit, cash only  [facility] Adult Dental Access PROGRAM  [address], [facility] [phone] Patients are seen by appointment only. Walk-ins are not accepted. [place] will see patients 66 years of age and older. One Wednesday Evening (Monthly: Volunteer Based).  $30 per visit, cash only  [place]  [phone] for adults; Children under age 39, call Graduate Pediatric Dentistry at [phone]. Children aged 45-14, please call [phone] to request a pediatric application.  Dental services are provided in all areas of dental care including fillings, crowns and bridges, complete and partial dentures, implants, gum treatment, root canals, and extractions. Preventive care is also provided. Treatment is provided to both adults and children. Patients are selected via a lottery and there is often a waiting list.   [facility] [address] [place]  [phone]  [email or web address]   [facility] [address] [place], [place] [phone], Ext. [phone] Second and Fourth Thursday of each month, opens at 6:30 AM; Clinic ends at 9 AM.  Patients are seen on a first-come first-served basis, and a limited number are seen during each clinic.   [facility]  [address] Hillard Danker [place], [place] [phone]   Eligibility Requirements You must have lived in [place], [place], or [place] counties for at least the last three months.   You cannot be eligible for state or federal sponsored [employer], including [employer], [place], or [employer].   You generally cannot be eligible for healthcare insurance through your employer.    How to apply: Eligibility screenings are held every Tuesday and Wednesday afternoon from 1:00 pm until 4:00 pm. You do not need an appointment for the interview!  [facility] [address], [place], [place]   [place]  [place] Department  [place]  [phone]    Behavioral Health Resources in the Community: Intensive Outpatient Programs Organization         Address  Phone  Notes  [place]. [address] [place], [place] [phone]   [facility] [address] [place], [place]   ADS: Alcohol & Drug Svcs [address], [place], [place]   [place] [address],  [place], [place] or [phone]   Substance Abuse Resources Organization         Address  Phone  Notes  Alcohol and Drug Services  [phone]   [place]  [phone]   [facility] [place]   Chinita Pester  [phone]   Residential & Outpatient Substance Abuse Program  [phone]   Psychological Services Organization          Address  Phone  Notes  [facility] [place]  [place]  [phone]   [place] [address], [place] or [phone]    Mobile Crisis Teams Organization         Address  Phone  Notes  [facility], Mobile Crisis Care Unit  [phone]   [facility]  [address] [place], [place]   [facility] [address] [place] [phone]    Self-Help/Support Groups Organization  Address  Phone             Notes  [place]. of [place] - variety of support groups  [place] Call for more information  Narcotics Anonymous (NA), [facility] [address], [employer] [place]  2 meetings at this location   Special educational needs teacher         Address  Phone  Notes  ASAP Residential Treatment [place],    [place]  [phone]   [facility]  [address] [place] [phone], [place], [place]   [place], [place] [phone] Admissions: 8am-3pm M-F  Incentives Substance [place] [address]    [place], [place] [phone]   [facility] [address] [place], [place], [place]   [facility] [address]  [place], [place]   [facility] - Intensive Outpatient [address], [place], [place]   [facility] ([place] [address].,  [place], [place] [phone] or [phone]   [facility] (RTS) [address] [place], [place] Accepts Medicaid  [facility] [place] [address],  [place] [phone] Substance Abuse/Addiction Treatment   [facility] Organization         Address  Phone  Notes  [facility]  [phone]   Domenic Schwab, PhD [address] Arlis Porta [place], [place]   [phone] or [phone]   [place], [place] [phone]   [facility] [address], [place], [place] [phone] Insurance/Medicaid/sponsorship through [facility] and Families [address], [address] [place]                                    [place], [place] [phone] [place] [address], [place] [phone]    Dr. Adele Schilder  [phone]   [facility] Dept. 1) [address], [place] 2) [place] 3)  [place] [address], [place] [phone]  [phone]   [place] [phone] or [phone] (After Hours)

## 2015-09-24 NOTE — ED Notes (Signed)
Pt reports intermittent dizziness x 7 days.  Started off with L arm numbness, today his R arm is numb.  Pt reports pressure in R side of his head today.  Pt reports he was driving when he became nauseous and vomited.

## 2015-09-24 NOTE — ED Notes (Signed)
Pt states that he has had some nerve issues on Left side primarily left arm and left thigh with thigh having been "numb"  for 3 years.  The arm has been having intermittent issues with numbness.  Today he has had numbness on the right arm and right side of face.  Pt denies pain.  Pt c/o dizziness today also.

## 2015-10-10 ENCOUNTER — Ambulatory Visit: Payer: Managed Care, Other (non HMO) | Admitting: Family Medicine

## 2015-10-10 DIAGNOSIS — Z0289 Encounter for other administrative examinations: Secondary | ICD-10-CM

## 2015-10-12 ENCOUNTER — Emergency Department (HOSPITAL_COMMUNITY)
Admission: EM | Admit: 2015-10-12 | Discharge: 2015-10-12 | Discharge status: Absent without leave | Payer: Managed Care, Other (non HMO) | Source: Home / Self Care

## 2017-05-05 ENCOUNTER — Emergency Department (HOSPITAL_COMMUNITY)
Admission: EM | Admit: 2017-05-05 | Discharge: 2017-05-05 | Disposition: A | Discharge status: Home / Self Care | Payer: Managed Care, Other (non HMO) | Attending: Emergency Medicine | Admitting: Emergency Medicine

## 2017-05-05 ENCOUNTER — Encounter (HOSPITAL_COMMUNITY): Payer: Self-pay

## 2017-05-05 DIAGNOSIS — Y9389 Activity, other specified: Secondary | ICD-10-CM | POA: Insufficient documentation

## 2017-05-05 DIAGNOSIS — Y929 Unspecified place or not applicable: Secondary | ICD-10-CM | POA: Insufficient documentation

## 2017-05-05 DIAGNOSIS — Z87891 Personal history of nicotine dependence: Secondary | ICD-10-CM | POA: Insufficient documentation

## 2017-05-05 DIAGNOSIS — W458XXA Other foreign body or object entering through skin, initial encounter: Secondary | ICD-10-CM | POA: Insufficient documentation

## 2017-05-05 DIAGNOSIS — Y990 Civilian activity done for income or pay: Secondary | ICD-10-CM | POA: Insufficient documentation

## 2017-05-05 DIAGNOSIS — T1591XA Foreign body on external eye, part unspecified, right eye, initial encounter: Secondary | ICD-10-CM

## 2017-05-05 DIAGNOSIS — Z23 Encounter for immunization: Secondary | ICD-10-CM | POA: Insufficient documentation

## 2017-05-05 MED ORDER — TETRACAINE HCL 0.5 % OP SOLN
2.0000 [drp] | Freq: Once | OPHTHALMIC | Status: AC
  Administered 2017-05-05: 2 [drp] via OPHTHALMIC
  Filled 2017-05-05: qty 4

## 2017-05-05 MED ORDER — FLUORESCEIN SODIUM 0.6 MG OP STRP
1.0000 | ORAL_STRIP | Freq: Once | OPHTHALMIC | Status: AC
  Administered 2017-05-05: 1 via OPHTHALMIC
  Filled 2017-05-05: qty 1

## 2017-05-05 MED ORDER — TETANUS-DIPHTH-ACELL PERTUSSIS 5-2.5-18.5 LF-MCG/0.5 IM SUSP
0.5000 mL | Freq: Once | INTRAMUSCULAR | Status: AC
  Administered 2017-05-05: 0.5 mL via INTRAMUSCULAR
  Filled 2017-05-05: qty 0.5

## 2017-05-05 MED ORDER — ERYTHROMYCIN 5 MG/GM OP OINT
TOPICAL_OINTMENT | OPHTHALMIC | 0 refills | Status: AC
Start: 2017-05-05 — End: ?

## 2017-05-05 MED ORDER — HYDROCODONE-ACETAMINOPHEN 5-325 MG PO TABS: 1.0000 | ORAL_TABLET | Freq: Four times a day (QID) | ORAL | 0 refills | Status: AC | PRN

## 2017-05-05 MED ORDER — HYDROCODONE-ACETAMINOPHEN 5-325 MG PO TABS
1.0000 | ORAL_TABLET | Freq: Once | ORAL | Status: DC
  Filled 2017-05-05: qty 1

## 2017-05-05 NOTE — ED Provider Notes (Signed)
[facility]-EMER[facility]ENCY DEPT Provider Note   CSN: [id number] Arrival date & time: 05/05/17  1191  By signing my name below, Alexia Julien Girt, attest that this documentation has been prepared under the direction and in the presence of non physician practitioner, Ebbie Ridge, PA-C.    Electronically Signed: Sandrea Hammond, Scribe 05/05/2017. 9:00 PM.    History   Chief Complaint Chief Complaint  Patient presents with  . Foreign Body in Eye    RIGHT    The history is provided by the patient. No language interpreter was used.    HPI Comments:  Bow Buntyn is a 31 y.o. male who presents to the Emergency Department complaining of constant moderate right eye pain that began 11 hours ago. 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 He used eye wash PTA with no relief. Denies visual disturbance or fever.  Past Medical History:  Diagnosis Date  . Acid reflux     There are no active problems to display for this patient.   History reviewed. No pertinent surgical history.     Home Medications    Prior to Admission medications   Medication Sig Start Date End Date Taking? Authorizing Provider  erythromycin ophthalmic ointment Place a 1/2 inch ribbon of ointment into the lower eyelid. 05/05/17   Deatra Mcmahen, Cristal Deer, PA-C  HYDROcodone-acetaminophen (NORCO/VICODIN) 5-325 MG tablet Take 1 tablet by mouth every 6 (six) hours as needed for moderate pain. 05/05/17   Jadon Harbaugh, Cristal Deer, PA-C  meclizine (ANTIVERT) 50 MG tablet Take 0.5 tablets (25 mg total) by mouth 3 (three) times daily as needed. 09/24/15   Barrett Henle, PA-C  naproxen sodium (ANAPROX) 220 MG tablet Take 660 mg by mouth 2 (two) times daily with a meal.    [provider]  omeprazole (PRILOSEC OTC) 20 MG tablet Take 20 mg by mouth as needed (acid reflux).     [provider]    Family History History reviewed. No pertinent family history.  Social History Social  History  Substance Use Topics  . Smoking status: Former Games developer  . Smokeless tobacco: Never Used  . Alcohol use No     Allergies   Patient has no known allergies.   Review of Systems Review of Systems All other systems reviewed and negative for acute change except as noted in the HPI.  Physical Exam Updated Vital Signs BP (!) 141/94 (BP Location: Left Arm)   Pulse 96   Temp 98.1 F (36.7 C) (Oral)   Resp 20   Ht 5' 6.5" (1.689 m)   Wt 171 lb 8 oz (77.8 kg)   SpO2 100%   BMI 27.27 kg/m   Physical Exam  Constitutional: He is oriented to person, place, and time. He appears well-developed and well-nourished. No distress.  HENT:  Head: Normocephalic and atraumatic.  Eyes: Pupils are equal, round, and reactive to light. Foreign body present in the right eye.    Pulmonary/Chest: Effort normal.  Neurological: He is alert and oriented to person, place, and time.  Skin: Skin is warm and dry.  Psychiatric: He has a normal mood and affect.  Nursing note and vitals reviewed.    ED Treatments / Results  DIAGNOSTIC STUDIES:  Oxygen Saturation is 100% on room air, normal, by my interpretation.    COORDINATION OF CARE:  9:02 PM Discussed treatment plan with pt at bedside and pt agreed to plan. Eye drops administered. Treatment plan includes attempted foreign body removal.  Consulted ophthalmologist. Pt prefered to see the ophthalmologist, declined any further attempted removal in the ED.    Labs (all labs ordered are listed, but only abnormal results are displayed) Labs Reviewed - No data to display  EKG  EKG Interpretation None       Radiology No results found.  Procedures Procedures (including critical care time)  Medications Ordered in ED Medications  HYDROcodone-acetaminophen (NORCO/VICODIN) 5-325 MG per tablet 1 tablet (not administered)  Tdap (BOOSTRIX) injection 0.5 mL (not administered)  tetracaine (PONTOCAINE) 0.5 % ophthalmic solution 2 drop (2 drops  Right Eye Given by Other 05/05/17 2028)  fluorescein ophthalmic strip 1 strip (1 strip Right Eye Given by Other 05/05/17 2028)     Initial Impression / Assessment and Plan / ED Course  I have reviewed the triage vital signs and the nursing notes.  Pertinent labs & imaging results that were available during my care of the patient were reviewed by me and considered in my medical decision making (see chart for details).    Final Clinical Impressions(s) / ED Diagnoses   Final diagnoses:  Foreign body of right eye, initial encounter    New Prescriptions New Prescriptions   ERYTHROMYCIN OPHTHALMIC OINTMENT    Place a 1/2 inch ribbon of ointment into the lower eyelid.   HYDROCODONE-ACETAMINOPHEN (NORCO/VICODIN) 5-325 MG TABLET    Take 1 tablet by mouth every 6 (six) hours as needed for moderate pain.    I personally performed the services described in this documentation, which was scribed in my presence. The recorded information has been reviewed and is accurate.    Charlestine NightLawyer, Elleanor Guyett, PA-C 05/07/17 0145    Derwood KaplanNanavati, Ankit, MD 05/08/17 1408

## 2017-05-05 NOTE — Discharge Instructions (Signed)
Go to the Eye Doctor's Office at 8 a.m. Return here as needed.

## 2017-05-05 NOTE — ED Triage Notes (Signed)
PT C/O RIGHT EYE PAIN SINCE THIS AM WHILE AT WORK. 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 PT STS SINCE 3 PM THE PAIN HAS GOTTEN WORSE. HE TRIED TO REMOVE IT WITH NO LUCK.
